# Patient Record
Sex: Male | Born: 1955
Health system: Southern US, Community
[De-identification: ages and names within clinical notes are randomized; demographics above are authoritative.]

## PROBLEM LIST (undated history)

## (undated) DIAGNOSIS — G5600 Carpal tunnel syndrome, unspecified upper limb: Secondary | ICD-10-CM

## (undated) DIAGNOSIS — E785 Hyperlipidemia, unspecified: Secondary | ICD-10-CM

## (undated) DIAGNOSIS — M1990 Unspecified osteoarthritis, unspecified site: Secondary | ICD-10-CM

## (undated) DIAGNOSIS — I10 Essential (primary) hypertension: Secondary | ICD-10-CM

## (undated) DIAGNOSIS — M109 Gout, unspecified: Secondary | ICD-10-CM

## (undated) HISTORY — PX: KNEE ARTHROSCOPY: SUR90

## (undated) HISTORY — PX: BICEPS TENDON REPAIR: SHX566

## (undated) HISTORY — PX: APPENDECTOMY: SHX54

---

## 1998-01-02 ENCOUNTER — Other Ambulatory Visit: Admission: RE | Admit: 1998-01-02 | Discharge: 1998-01-02 | Payer: Self-pay | Admitting: Urology

## 2003-08-30 ENCOUNTER — Ambulatory Visit (HOSPITAL_COMMUNITY): Admission: RE | Admit: 2003-08-30 | Discharge: 2003-08-30 | Payer: Self-pay | Admitting: Orthopedic Surgery

## 2005-03-26 IMAGING — CR DG CHEST 2V
2 series · 2 of 2 positions shown · non-contrast
Comparison: No prior studies for comparison.

CLINICAL DATA: Preop respiratory exam for ruptured biceps tendon.
 CHEST, TWO VIEWS 08/30/03

[view not recorded (1 of 2)]
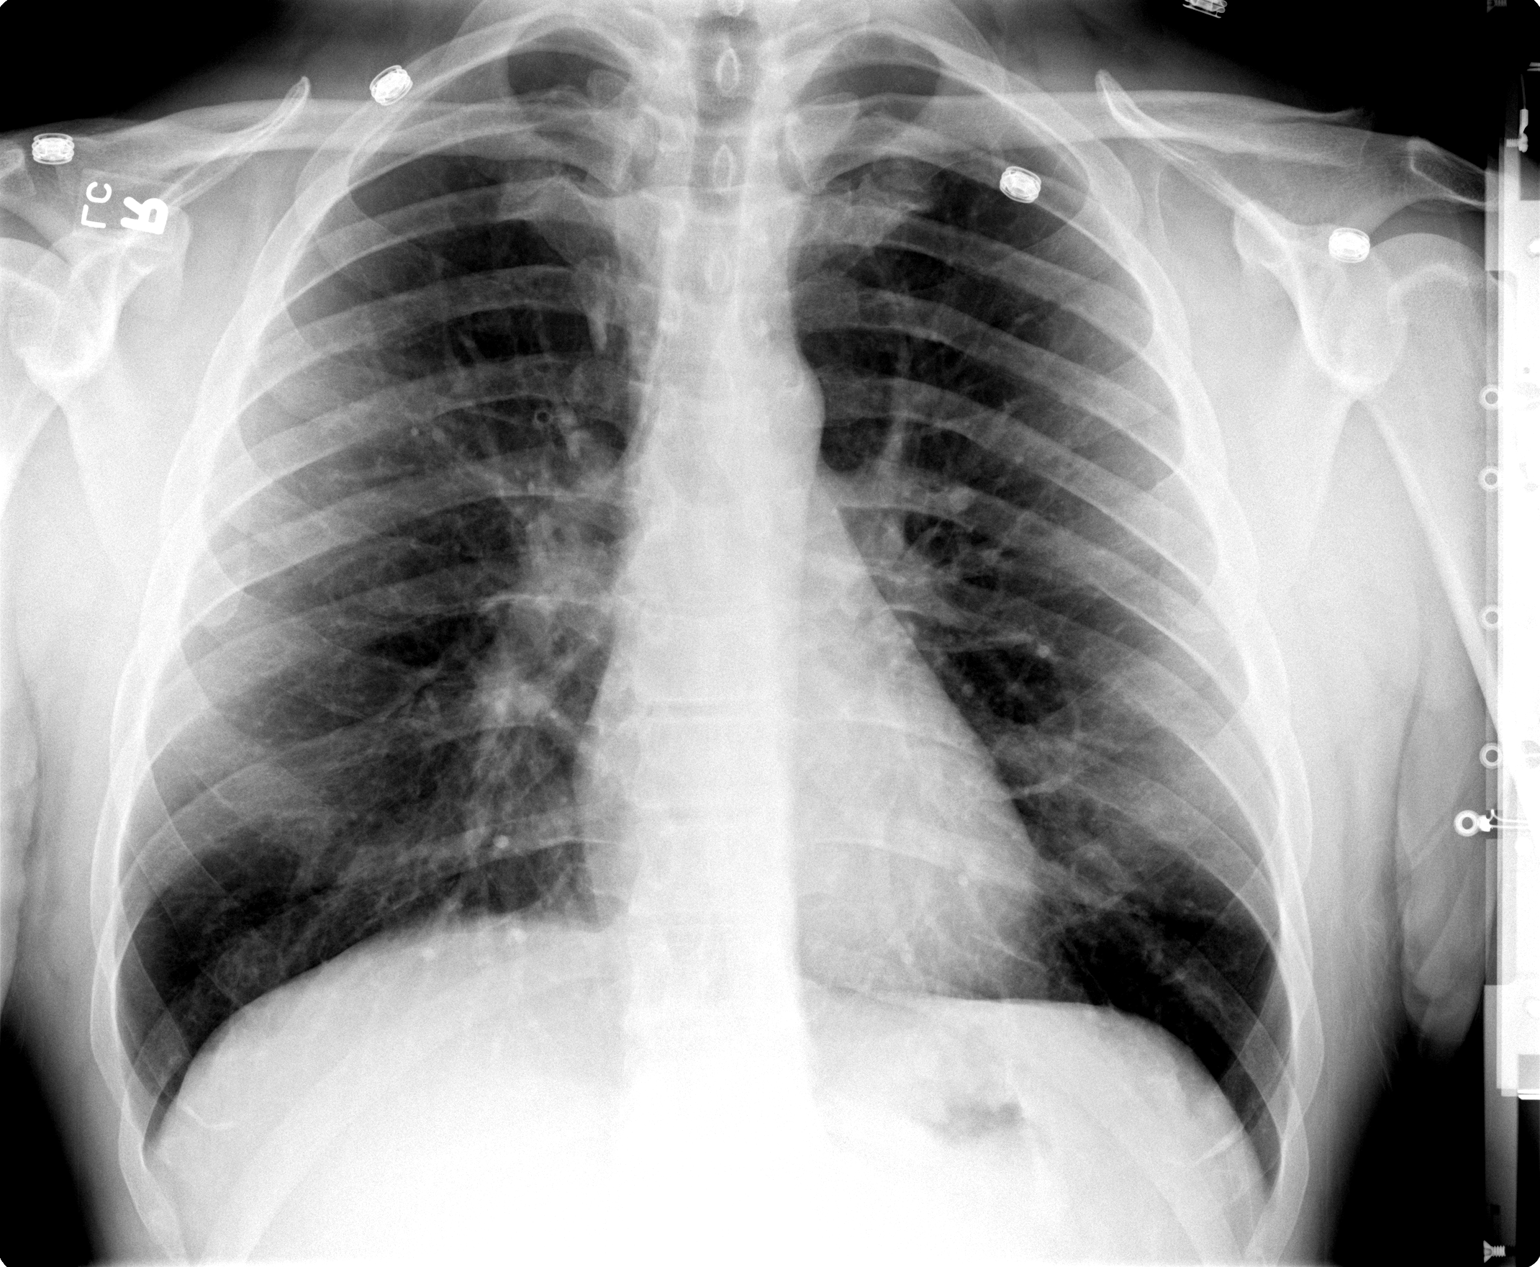

[view not recorded (2 of 2)]
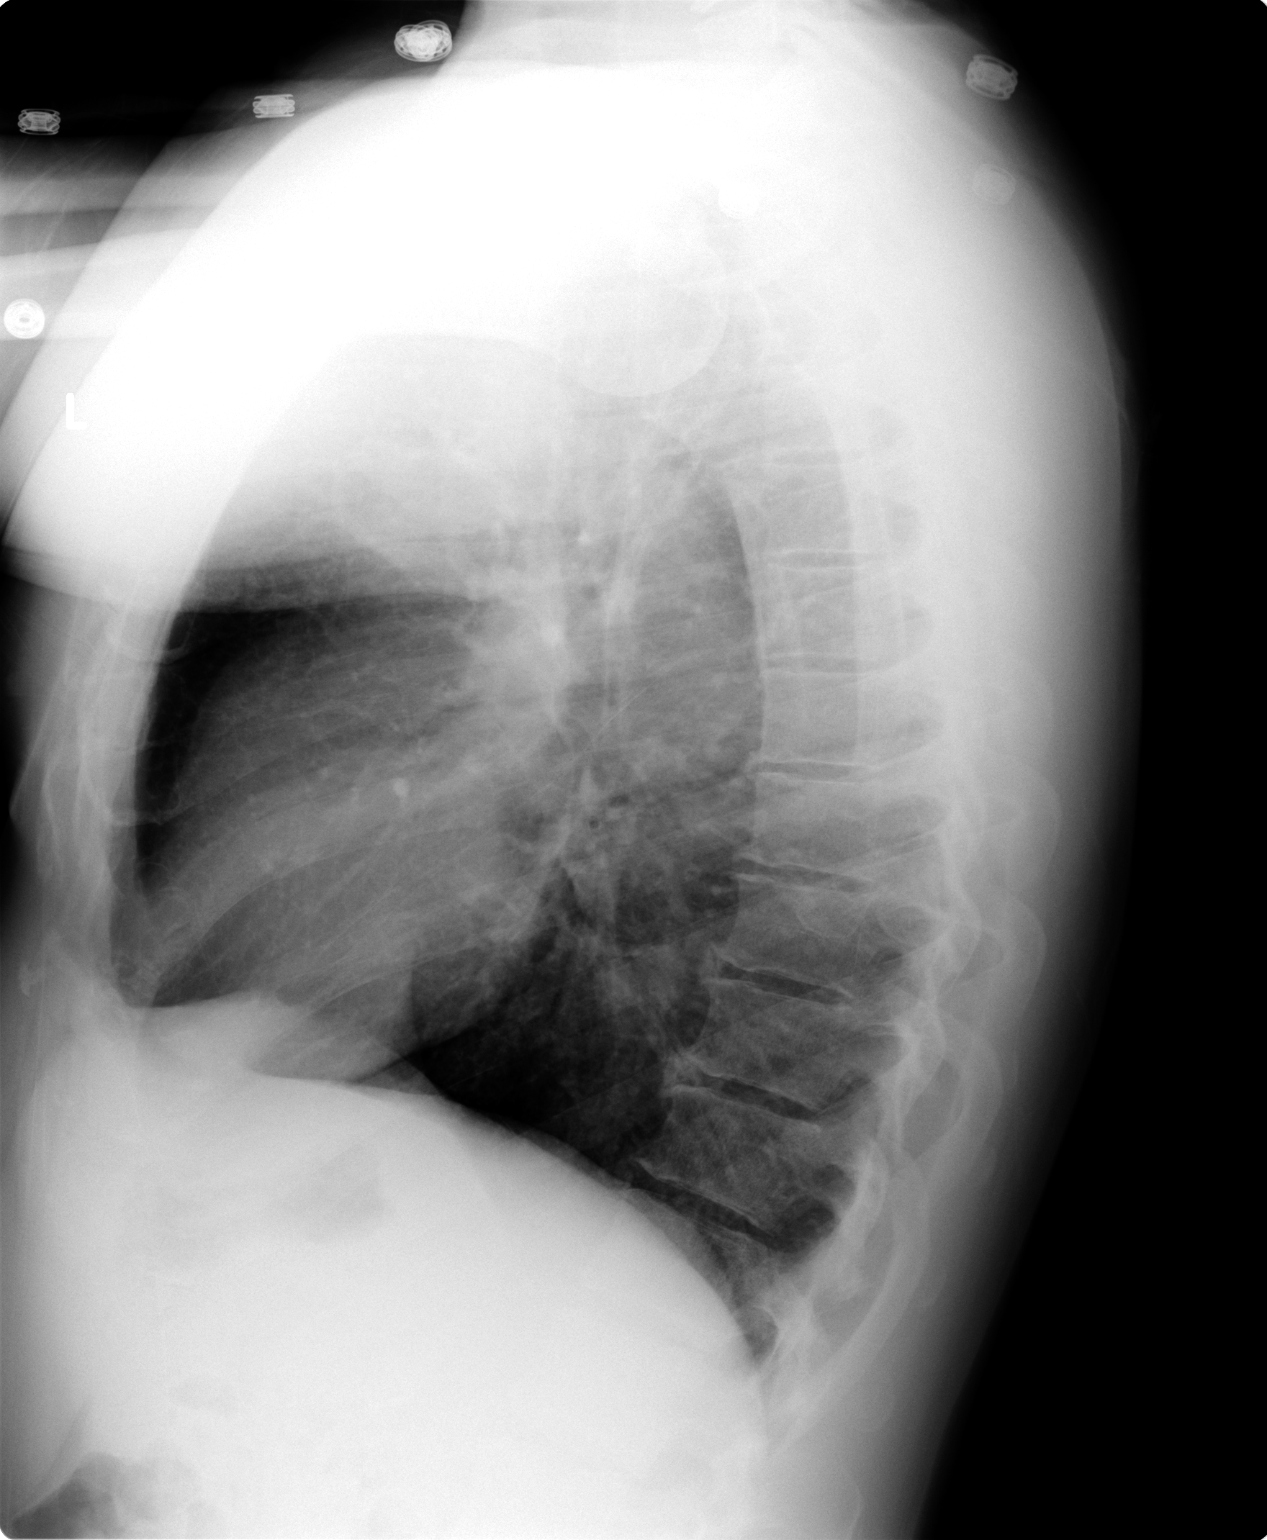

[2 of 2 positions shown; findings below may reference images not displayed]

The lungs show no infiltrates, nodules, or edema.  No pleural effusions.  Heart size is normal.  Bony thorax shows mild spondylosis of mid to lower thoracic spine. 
 IMPRESSION
 No active disease.

## 2012-07-02 ENCOUNTER — Ambulatory Visit
Admission: RE | Admit: 2012-07-02 | Discharge: 2012-07-02 | Disposition: A | Discharge status: Home / Self Care | Payer: 59 | Source: Ambulatory Visit | Attending: Dermatology | Admitting: Dermatology

## 2012-07-02 ENCOUNTER — Other Ambulatory Visit: Payer: Self-pay | Admitting: Dermatology

## 2012-07-02 DIAGNOSIS — R52 Pain, unspecified: Secondary | ICD-10-CM

## 2012-07-02 DIAGNOSIS — IMO0002 Reserved for concepts with insufficient information to code with codable children: Secondary | ICD-10-CM

## 2016-05-02 DIAGNOSIS — M109 Gout, unspecified: Secondary | ICD-10-CM | POA: Diagnosis not present

## 2016-05-02 DIAGNOSIS — Z1211 Encounter for screening for malignant neoplasm of colon: Secondary | ICD-10-CM | POA: Diagnosis not present

## 2016-05-02 DIAGNOSIS — I10 Essential (primary) hypertension: Secondary | ICD-10-CM | POA: Diagnosis not present

## 2016-05-02 DIAGNOSIS — Z13220 Encounter for screening for lipoid disorders: Secondary | ICD-10-CM | POA: Diagnosis not present

## 2016-05-02 DIAGNOSIS — Z0000 Encounter for general adult medical examination without abnormal findings: Secondary | ICD-10-CM | POA: Diagnosis not present

## 2016-05-02 DIAGNOSIS — N5201 Erectile dysfunction due to arterial insufficiency: Secondary | ICD-10-CM | POA: Diagnosis not present

## 2016-05-02 DIAGNOSIS — Z23 Encounter for immunization: Secondary | ICD-10-CM | POA: Diagnosis not present

## 2016-05-02 DIAGNOSIS — Z125 Encounter for screening for malignant neoplasm of prostate: Secondary | ICD-10-CM | POA: Diagnosis not present

## 2016-10-28 DIAGNOSIS — M79642 Pain in left hand: Secondary | ICD-10-CM | POA: Diagnosis not present

## 2016-11-08 DIAGNOSIS — D225 Melanocytic nevi of trunk: Secondary | ICD-10-CM | POA: Diagnosis not present

## 2016-11-08 DIAGNOSIS — D2372 Other benign neoplasm of skin of left lower limb, including hip: Secondary | ICD-10-CM | POA: Diagnosis not present

## 2017-04-09 DIAGNOSIS — M67442 Ganglion, left hand: Secondary | ICD-10-CM | POA: Diagnosis not present

## 2017-05-07 DIAGNOSIS — Z23 Encounter for immunization: Secondary | ICD-10-CM | POA: Diagnosis not present

## 2017-05-07 DIAGNOSIS — M109 Gout, unspecified: Secondary | ICD-10-CM | POA: Diagnosis not present

## 2017-05-07 DIAGNOSIS — Z0000 Encounter for general adult medical examination without abnormal findings: Secondary | ICD-10-CM | POA: Diagnosis not present

## 2017-05-21 DIAGNOSIS — Z0000 Encounter for general adult medical examination without abnormal findings: Secondary | ICD-10-CM | POA: Diagnosis not present

## 2017-05-23 ENCOUNTER — Telehealth: Payer: Self-pay | Admitting: Acute Care

## 2017-05-27 NOTE — Telephone Encounter (Signed)
LMTC x 1 at home number - (Number listed for mobile number was not pt's number)

## 2017-06-09 NOTE — Telephone Encounter (Signed)
Will close this message and refer to referral notes 

## 2017-09-09 DIAGNOSIS — H9312 Tinnitus, left ear: Secondary | ICD-10-CM | POA: Diagnosis not present

## 2017-09-09 DIAGNOSIS — H6123 Impacted cerumen, bilateral: Secondary | ICD-10-CM | POA: Diagnosis not present

## 2018-06-29 DIAGNOSIS — H6123 Impacted cerumen, bilateral: Secondary | ICD-10-CM | POA: Diagnosis not present

## 2018-06-29 DIAGNOSIS — Z13220 Encounter for screening for lipoid disorders: Secondary | ICD-10-CM | POA: Diagnosis not present

## 2018-06-29 DIAGNOSIS — M109 Gout, unspecified: Secondary | ICD-10-CM | POA: Diagnosis not present

## 2018-06-29 DIAGNOSIS — Z1211 Encounter for screening for malignant neoplasm of colon: Secondary | ICD-10-CM | POA: Diagnosis not present

## 2018-06-29 DIAGNOSIS — Z0000 Encounter for general adult medical examination without abnormal findings: Secondary | ICD-10-CM | POA: Diagnosis not present

## 2018-06-29 DIAGNOSIS — Z125 Encounter for screening for malignant neoplasm of prostate: Secondary | ICD-10-CM | POA: Diagnosis not present

## 2018-06-29 DIAGNOSIS — I10 Essential (primary) hypertension: Secondary | ICD-10-CM | POA: Diagnosis not present

## 2018-07-02 DIAGNOSIS — H9312 Tinnitus, left ear: Secondary | ICD-10-CM | POA: Diagnosis not present

## 2018-07-02 DIAGNOSIS — H6121 Impacted cerumen, right ear: Secondary | ICD-10-CM | POA: Diagnosis not present

## 2018-08-31 DIAGNOSIS — E7800 Pure hypercholesterolemia, unspecified: Secondary | ICD-10-CM | POA: Diagnosis not present

## 2019-05-05 DIAGNOSIS — M109 Gout, unspecified: Secondary | ICD-10-CM | POA: Diagnosis not present

## 2019-05-05 DIAGNOSIS — E7800 Pure hypercholesterolemia, unspecified: Secondary | ICD-10-CM | POA: Diagnosis not present

## 2019-05-05 DIAGNOSIS — Z0000 Encounter for general adult medical examination without abnormal findings: Secondary | ICD-10-CM | POA: Diagnosis not present

## 2019-05-05 DIAGNOSIS — Z125 Encounter for screening for malignant neoplasm of prostate: Secondary | ICD-10-CM | POA: Diagnosis not present

## 2019-05-05 DIAGNOSIS — Z23 Encounter for immunization: Secondary | ICD-10-CM | POA: Diagnosis not present

## 2019-05-05 DIAGNOSIS — I10 Essential (primary) hypertension: Secondary | ICD-10-CM | POA: Diagnosis not present

## 2019-05-31 DIAGNOSIS — Z20828 Contact with and (suspected) exposure to other viral communicable diseases: Secondary | ICD-10-CM | POA: Diagnosis not present

## 2019-06-15 DIAGNOSIS — Z23 Encounter for immunization: Secondary | ICD-10-CM | POA: Diagnosis not present

## 2020-06-14 DIAGNOSIS — G5603 Carpal tunnel syndrome, bilateral upper limbs: Secondary | ICD-10-CM | POA: Diagnosis not present

## 2020-06-14 DIAGNOSIS — Z0000 Encounter for general adult medical examination without abnormal findings: Secondary | ICD-10-CM | POA: Diagnosis not present

## 2020-06-14 DIAGNOSIS — M109 Gout, unspecified: Secondary | ICD-10-CM | POA: Diagnosis not present

## 2020-06-14 DIAGNOSIS — I10 Essential (primary) hypertension: Secondary | ICD-10-CM | POA: Diagnosis not present

## 2020-06-14 DIAGNOSIS — E7800 Pure hypercholesterolemia, unspecified: Secondary | ICD-10-CM | POA: Diagnosis not present

## 2020-06-14 DIAGNOSIS — Z125 Encounter for screening for malignant neoplasm of prostate: Secondary | ICD-10-CM | POA: Diagnosis not present

## 2020-06-14 DIAGNOSIS — F5221 Male erectile disorder: Secondary | ICD-10-CM | POA: Diagnosis not present

## 2020-06-14 DIAGNOSIS — Z79899 Other long term (current) drug therapy: Secondary | ICD-10-CM | POA: Diagnosis not present

## 2020-06-14 DIAGNOSIS — Z23 Encounter for immunization: Secondary | ICD-10-CM | POA: Diagnosis not present

## 2020-11-27 ENCOUNTER — Other Ambulatory Visit: Payer: Self-pay

## 2020-11-27 DIAGNOSIS — R202 Paresthesia of skin: Secondary | ICD-10-CM

## 2020-11-28 ENCOUNTER — Ambulatory Visit (INDEPENDENT_AMBULATORY_CARE_PROVIDER_SITE_OTHER): Payer: Medicare HMO | Admitting: Neurology

## 2020-11-28 ENCOUNTER — Other Ambulatory Visit: Payer: Self-pay

## 2020-11-28 DIAGNOSIS — R202 Paresthesia of skin: Secondary | ICD-10-CM | POA: Diagnosis not present

## 2020-11-28 DIAGNOSIS — G5603 Carpal tunnel syndrome, bilateral upper limbs: Secondary | ICD-10-CM

## 2020-11-28 NOTE — Procedures (Signed)
[facility]  [address], [address]  [place], [place] [address] Tel: [phone] Fax:  [phone] Test Date:  11/28/2020  Patient: Frank Moran DOB: 11/05/1955 Physician: Nita Sickle, DO  Sex: Male Height: 5\' 9"  Ref Phys: , M.D.  ID#: [id number]   Technician:    Patient Complaints: This is a 65 year old man referred for evaluation of bilateral hand numbness and pain.  NCV & EMG Findings: Extensive electrodiagnostic testing of the right upper extremity and additional studies of the left shows:  1. Bilateral median sensory responses are absent.  Bilateral ulnar sensory responses are within normal limits. 2. Bilateral median motor responses show severely prolonged latency (L12.2, R10.9 ms) and reduced amplitude (L2.4, R2.6 mV).  Of note, there is evidence of a Martin-Gruber anastomosis bilaterally, a normal anatomic variant.  Bilateral ulnar motor responses are within normal limits.  3. Chronic motor axon loss changes are seen affecting bilateral abductor pollicis brevis muscles, without accompanied active denervation.   Impression: 1. Bilateral median neuropathy at or distal to the wrist, consistent with a clinical diagnosis of carpal tunnel syndrome.  Overall, these findings are very severe in degree electrically. 2. Incidentally, there is evidence of bilateral Martin-Gruber anastomoses, a normal anatomic variant.   ___________________________ 75, DO    Nerve Conduction Studies Anti Sensory Summary Table   Stim Site NR Peak (ms) Norm Peak (ms) P-T Amp (V) Norm P-T Amp  Left Median Anti Sensory (2nd Digit)  32C  Wrist NR  <3.8  >10  Right Median Anti Sensory (2nd Digit)  32C  Wrist NR  <3.8  >10  Left Ulnar Anti Sensory (5th Digit)  32C  Wrist    2.9 <3.2 16.2 >5  Right Ulnar Anti Sensory (5th Digit)  32C  Wrist    3.0 <3.2 16.1 >5   Motor Summary Table   Stim Site NR Onset (ms) Norm Onset (ms) O-P Amp (mV) Norm O-P Amp Site1  Site2 Delta-0 (ms) Dist (cm) Vel (m/s) Norm Vel (m/s)  Left Median Motor (Abd Poll Brev)  32C  Wrist    12.2 <4.0 2.4 >5 Elbow Wrist 5.1 27.0 53 >50  Elbow    17.3  2.0  Ulnar-wrist crossover Elbow 13.1 0.0    Ulnar-wrist crossover    4.2  1.7         Right Median Motor (Abd Poll Brev)  32C  Wrist    10.9 <4.0 2.6 >5 Elbow Wrist 4.5 27.0 60 >50  Elbow    15.4  2.1  Ulnar-wrist crossover Elbow 11.9 0.0    Ulnar-wrist crossover    3.5  1.4         Left Ulnar Motor (Abd Dig Minimi)  32C  Wrist    2.8 <3.1 8.5 >7 B Elbow Wrist 3.7 22.0 59 >50  B Elbow    6.5  8.1  A Elbow B Elbow 2.0 10.0 50 >50  A Elbow    8.5  8.0         Right Ulnar Motor (Abd Dig Minimi)  32C  Wrist    2.7 <3.1 7.3 >7 B Elbow Wrist 3.6 22.0 61 >50  B Elbow    6.3  6.1  A Elbow B Elbow 1.7 10.0 59 >50  A Elbow    8.0  5.9          EMG   Side Muscle Ins Act Fibs Psw Fasc Number Recrt Dur Dur. Amp Amp. Poly Poly. Comment  Right  1stDorInt Nml Nml Nml Nml Nml Nml Nml Nml Nml Nml Nml Nml N/A  Right Abd Poll Brev Nml Nml Nml Nml 3- Rapid Many 1+ Many 1+ Many 1+ N/A  Right Ext Indicis Nml Nml Nml Nml Nml Nml Nml Nml Nml Nml Nml Nml N/A  Right PronatorTeres Nml Nml Nml Nml Nml Nml Nml Nml Nml Nml Nml Nml N/A  Right Biceps Nml Nml Nml Nml Nml Nml Nml Nml Nml Nml Nml Nml N/A  Right Triceps Nml Nml Nml Nml Nml Nml Nml Nml Nml Nml Nml Nml N/A  Right Deltoid Nml Nml Nml Nml Nml Nml Nml Nml Nml Nml Nml Nml N/A  Left 1stDorInt Nml Nml Nml Nml Nml Nml Nml Nml Nml Nml Nml Nml N/A  Left Abd Poll Brev Nml Nml Nml Nml 3- Rapid Most 1+ Most 1+ Most 1+ N/A  Left PronatorTeres Nml Nml Nml Nml Nml Nml Nml Nml Nml Nml Nml Nml N/A  Left Biceps Nml Nml Nml Nml Nml Nml Nml Nml Nml Nml Nml Nml N/A  Left Triceps Nml Nml Nml Nml Nml Nml Nml Nml Nml Nml Nml Nml N/A  Left Deltoid Nml Nml Nml Nml Nml Nml Nml Nml Nml Nml Nml Nml N/A      Waveforms:

## 2020-12-27 DIAGNOSIS — R200 Anesthesia of skin: Secondary | ICD-10-CM | POA: Diagnosis not present

## 2020-12-27 DIAGNOSIS — R202 Paresthesia of skin: Secondary | ICD-10-CM | POA: Diagnosis not present

## 2020-12-27 DIAGNOSIS — M180 Bilateral primary osteoarthritis of first carpometacarpal joints: Secondary | ICD-10-CM | POA: Diagnosis not present

## 2020-12-27 DIAGNOSIS — G5603 Carpal tunnel syndrome, bilateral upper limbs: Secondary | ICD-10-CM | POA: Diagnosis not present

## 2020-12-27 DIAGNOSIS — M19132 Post-traumatic osteoarthritis, left wrist: Secondary | ICD-10-CM | POA: Diagnosis not present

## 2021-01-17 DIAGNOSIS — G5602 Carpal tunnel syndrome, left upper limb: Secondary | ICD-10-CM | POA: Diagnosis not present

## 2021-01-18 ENCOUNTER — Other Ambulatory Visit: Payer: Self-pay | Admitting: Orthopedic Surgery

## 2021-02-20 DIAGNOSIS — H6123 Impacted cerumen, bilateral: Secondary | ICD-10-CM | POA: Diagnosis not present

## 2021-02-21 ENCOUNTER — Other Ambulatory Visit: Payer: Self-pay

## 2021-02-21 ENCOUNTER — Encounter (HOSPITAL_BASED_OUTPATIENT_CLINIC_OR_DEPARTMENT_OTHER): Payer: Self-pay | Admitting: Orthopedic Surgery

## 2021-03-01 ENCOUNTER — Encounter (HOSPITAL_BASED_OUTPATIENT_CLINIC_OR_DEPARTMENT_OTHER): Payer: Self-pay | Admitting: Orthopedic Surgery

## 2021-03-01 ENCOUNTER — Ambulatory Visit (HOSPITAL_BASED_OUTPATIENT_CLINIC_OR_DEPARTMENT_OTHER)
Admission: RE | Admit: 2021-03-01 | Discharge: 2021-03-01 | Disposition: A | Discharge status: Home / Self Care | Payer: Medicare HMO | Attending: Orthopedic Surgery | Admitting: Orthopedic Surgery

## 2021-03-01 ENCOUNTER — Ambulatory Visit (HOSPITAL_BASED_OUTPATIENT_CLINIC_OR_DEPARTMENT_OTHER): Payer: Medicare HMO | Admitting: Anesthesiology

## 2021-03-01 ENCOUNTER — Ambulatory Visit (HOSPITAL_BASED_OUTPATIENT_CLINIC_OR_DEPARTMENT_OTHER): Payer: Medicare HMO

## 2021-03-01 ENCOUNTER — Encounter (HOSPITAL_BASED_OUTPATIENT_CLINIC_OR_DEPARTMENT_OTHER)
Admission: RE | Disposition: A | Discharge status: Home / Self Care | Payer: Self-pay | Source: Home / Self Care | Attending: Orthopedic Surgery

## 2021-03-01 ENCOUNTER — Other Ambulatory Visit: Payer: Self-pay

## 2021-03-01 DIAGNOSIS — G5603 Carpal tunnel syndrome, bilateral upper limbs: Secondary | ICD-10-CM | POA: Diagnosis not present

## 2021-03-01 DIAGNOSIS — I10 Essential (primary) hypertension: Secondary | ICD-10-CM | POA: Diagnosis not present

## 2021-03-01 DIAGNOSIS — Z87891 Personal history of nicotine dependence: Secondary | ICD-10-CM | POA: Diagnosis not present

## 2021-03-01 DIAGNOSIS — E785 Hyperlipidemia, unspecified: Secondary | ICD-10-CM | POA: Diagnosis not present

## 2021-03-01 DIAGNOSIS — G5602 Carpal tunnel syndrome, left upper limb: Secondary | ICD-10-CM | POA: Diagnosis not present

## 2021-03-01 HISTORY — DX: Essential (primary) hypertension: I10

## 2021-03-01 HISTORY — DX: Carpal tunnel syndrome, unspecified upper limb: G56.00

## 2021-03-01 HISTORY — DX: Hyperlipidemia, unspecified: E78.5

## 2021-03-01 HISTORY — PX: CARPAL TUNNEL RELEASE: SHX101

## 2021-03-01 HISTORY — DX: Unspecified osteoarthritis, unspecified site: M19.90

## 2021-03-01 HISTORY — DX: Gout, unspecified: M10.9

## 2021-03-01 SURGERY — CARPAL TUNNEL RELEASE
Anesthesia: Monitor Anesthesia Care | Site: Wrist | Laterality: Left

## 2021-03-01 MED ORDER — CEFAZOLIN SODIUM-DEXTROSE 2-4 GM/100ML-% IV SOLN
INTRAVENOUS | Status: AC
  Filled 2021-03-01: qty 100

## 2021-03-01 MED ORDER — MIDAZOLAM HCL 2 MG/2ML IJ SOLN
INTRAMUSCULAR | Status: AC
  Filled 2021-03-01: qty 2

## 2021-03-01 MED ORDER — FENTANYL CITRATE (PF) 100 MCG/2ML IJ SOLN: 25.0000 ug | INTRAMUSCULAR | Status: DC | PRN

## 2021-03-01 MED ORDER — LACTATED RINGERS IV SOLN: INTRAVENOUS | Status: DC

## 2021-03-01 MED ORDER — ACETAMINOPHEN 10 MG/ML IV SOLN: 1000.0000 mg | Freq: Once | INTRAVENOUS | Status: DC | PRN

## 2021-03-01 MED ORDER — BUPIVACAINE HCL (PF) 0.25 % IJ SOLN
INTRAMUSCULAR | Status: DC | PRN
  Administered 2021-03-01: 8 mL

## 2021-03-01 MED ORDER — ACETAMINOPHEN 160 MG/5ML PO SOLN: 1000.0000 mg | Freq: Once | ORAL | Status: DC | PRN

## 2021-03-01 MED ORDER — ONDANSETRON HCL 4 MG/2ML IJ SOLN
INTRAMUSCULAR | Status: DC | PRN
  Administered 2021-03-01: 4 mg via INTRAVENOUS

## 2021-03-01 MED ORDER — MIDAZOLAM HCL 5 MG/5ML IJ SOLN
INTRAMUSCULAR | Status: DC | PRN
Start: 2021-03-01 — End: 2021-03-01
  Administered 2021-03-01: 2 mg via INTRAVENOUS

## 2021-03-01 MED ORDER — FENTANYL CITRATE (PF) 100 MCG/2ML IJ SOLN
INTRAMUSCULAR | Status: DC | PRN
  Administered 2021-03-01: 100 ug via INTRAVENOUS

## 2021-03-01 MED ORDER — LIDOCAINE HCL (PF) 0.5 % IJ SOLN
INTRAMUSCULAR | Status: DC | PRN
  Administered 2021-03-01: 30 mL via INTRAVENOUS

## 2021-03-01 MED ORDER — PROPOFOL 10 MG/ML IV BOLUS
INTRAVENOUS | Status: AC
  Filled 2021-03-01: qty 20

## 2021-03-01 MED ORDER — PROPOFOL 10 MG/ML IV BOLUS
INTRAVENOUS | Status: DC | PRN
  Administered 2021-03-01 (×2): 10 mg via INTRAVENOUS
  Administered 2021-03-01: 20 mg via INTRAVENOUS
  Administered 2021-03-01 (×3): 10 mg via INTRAVENOUS

## 2021-03-01 MED ORDER — CEFAZOLIN SODIUM-DEXTROSE 2-4 GM/100ML-% IV SOLN
2.0000 g | INTRAVENOUS | Status: AC
  Administered 2021-03-01: 2 g via INTRAVENOUS

## 2021-03-01 MED ORDER — TRAMADOL HCL 50 MG PO TABS: 50.0000 mg | ORAL_TABLET | Freq: Four times a day (QID) | ORAL | 0 refills | Status: AC | PRN

## 2021-03-01 MED ORDER — OXYCODONE HCL 5 MG/5ML PO SOLN
5.0000 mg | Freq: Once | ORAL | Status: DC | PRN
Start: 2021-03-01 — End: 2021-03-01

## 2021-03-01 MED ORDER — 0.9 % SODIUM CHLORIDE (POUR BTL) OPTIME
TOPICAL | Status: DC | PRN
  Administered 2021-03-01: 50 mL

## 2021-03-01 MED ORDER — FENTANYL CITRATE (PF) 100 MCG/2ML IJ SOLN
INTRAMUSCULAR | Status: AC
  Filled 2021-03-01: qty 2

## 2021-03-01 MED ORDER — ONDANSETRON HCL 4 MG/2ML IJ SOLN
INTRAMUSCULAR | Status: AC
  Filled 2021-03-01: qty 2

## 2021-03-01 MED ORDER — OXYCODONE HCL 5 MG PO TABS: 5.0000 mg | ORAL_TABLET | Freq: Once | ORAL | Status: DC | PRN

## 2021-03-01 MED ORDER — ACETAMINOPHEN 500 MG PO TABS: 1000.0000 mg | ORAL_TABLET | Freq: Once | ORAL | Status: DC | PRN

## 2021-03-01 SURGICAL SUPPLY — 33 items
APL PRP STRL LF DISP 70% ISPRP (MISCELLANEOUS) ×1
BLADE SURG 15 STRL LF DISP TIS (BLADE) ×1 IMPLANT
BLADE SURG 15 STRL SS (BLADE) ×2
BNDG CMPR 9X4 STRL LF SNTH (GAUZE/BANDAGES/DRESSINGS)
BNDG COHESIVE 3X5 TAN STRL LF (GAUZE/BANDAGES/DRESSINGS) ×2 IMPLANT
BNDG ESMARK 4X9 LF (GAUZE/BANDAGES/DRESSINGS) IMPLANT
BNDG GAUZE ELAST 4 BULKY (GAUZE/BANDAGES/DRESSINGS) ×2 IMPLANT
CHLORAPREP W/TINT 26 (MISCELLANEOUS) ×2 IMPLANT
CORD BIPOLAR FORCEPS 12FT (ELECTRODE) ×2 IMPLANT
COVER BACK TABLE 60X90IN (DRAPES) ×2 IMPLANT
COVER MAYO STAND STRL (DRAPES) ×2 IMPLANT
CUFF TOURN SGL QUICK 18X4 (TOURNIQUET CUFF) ×2 IMPLANT
DRAPE EXTREMITY T 121X128X90 (DISPOSABLE) ×2 IMPLANT
DRAPE SURG 17X23 STRL (DRAPES) ×2 IMPLANT
DRSG PAD ABDOMINAL 8X10 ST (GAUZE/BANDAGES/DRESSINGS) ×2 IMPLANT
GAUZE SPONGE 4X4 12PLY STRL (GAUZE/BANDAGES/DRESSINGS) ×2 IMPLANT
GAUZE XEROFORM 1X8 LF (GAUZE/BANDAGES/DRESSINGS) ×2 IMPLANT
GLOVE SURG ORTHO LTX SZ8 (GLOVE) ×2 IMPLANT
GLOVE SURG UNDER POLY LF SZ8.5 (GLOVE) ×2 IMPLANT
GOWN STRL REUS W/ TWL LRG LVL3 (GOWN DISPOSABLE) IMPLANT
GOWN STRL REUS W/ TWL XL LVL3 (GOWN DISPOSABLE) ×1 IMPLANT
GOWN STRL REUS W/TWL LRG LVL3 (GOWN DISPOSABLE)
GOWN STRL REUS W/TWL XL LVL3 (GOWN DISPOSABLE) ×4 IMPLANT
NEEDLE PRECISIONGLIDE 27X1.5 (NEEDLE) ×2 IMPLANT
NS IRRIG 1000ML POUR BTL (IV SOLUTION) ×2 IMPLANT
PACK BASIN DAY SURGERY FS (CUSTOM PROCEDURE TRAY) ×2 IMPLANT
STOCKINETTE 4X48 STRL (DRAPES) ×2 IMPLANT
SUT ETHILON 4 0 PS 2 18 (SUTURE) ×2 IMPLANT
SUT VICRYL 4-0 PS2 18IN ABS (SUTURE) IMPLANT
SYR BULB EAR ULCER 3OZ GRN STR (SYRINGE) ×2 IMPLANT
SYR CONTROL 10ML LL (SYRINGE) ×2 IMPLANT
TOWEL GREEN STERILE FF (TOWEL DISPOSABLE) ×2 IMPLANT
UNDERPAD 30X36 HEAVY ABSORB (UNDERPADS AND DIAPERS) ×2 IMPLANT

## 2021-03-01 NOTE — Discharge Instructions (Addendum)

## 2021-03-01 NOTE — Anesthesia Postprocedure Evaluation (Signed)
Anesthesia Post Note  Patient: Frank Moran  Procedure(s) Performed: LEFT CARPAL TUNNEL RELEASE (Left: Wrist)     Patient location during evaluation: PACU Anesthesia Type: MAC Level of consciousness: awake and alert Pain management: pain level controlled Vital Signs Assessment: post-procedure vital signs reviewed and stable Respiratory status: spontaneous breathing, nonlabored ventilation, respiratory function stable and patient connected to nasal cannula oxygen Cardiovascular status: stable and blood pressure returned to baseline Postop Assessment: no apparent nausea or vomiting Anesthetic complications: no   No notable events documented.  Last Vitals:  Vitals:   03/01/21 0919 03/01/21 0930  BP: 104/68 118/81  Pulse: 69 66  Resp: 13 12  Temp: 36.6 C   SpO2: 96% 96%    Last Pain:  Vitals:   03/01/21 0919  TempSrc:   PainSc: 0-No pain                 Yannis Broce

## 2021-03-01 NOTE — H&P (Signed)
  Frank Moran is an 65 y.o. male.   Chief Complaint: numbness left hand HPI: Frank Moran is a 65 year old left hand dominant male referred by Dr. Docia Chuck for consultation regarding numbness and tingling with positive nerve conductions revealing severe carpal tunnel syndrome on the right with a motor delay of 12 2 on the left and 9 on the right and no response to the sensory component bilaterally. These were done in May 2022. These were done by Dr. Allena Moran. He is complaining of numbness tingling pain both hands left greater than right this been going on for years increasing over the past 6 months. He has a sharp pain with a VAS score 8/10. He has no history of injury to the hand or to the neck. He states driving and using pinch motion frequently increases his discomfort for him. He has not had any treatment for this. He has a history of arthritis no history of diabetes thyroid problems he does have a history of gout. Family history is positive arthritis negative for the remainder.  Past Medical History:  Diagnosis Date   Arthritis    Carpal tunnel syndrome    Gout    Hyperlipidemia    Hypertension     Past Surgical History:  Procedure Laterality Date   APPENDECTOMY     BICEPS TENDON REPAIR Left    KNEE ARTHROSCOPY Bilateral    right x5 left x1    History reviewed. No pertinent family history. Social History:  reports that he has quit smoking. His smoking use included cigarettes. He has never used smokeless tobacco. He reports current alcohol use. No history on file for drug use.  Allergies: No Known Allergies  No medications prior to admission.    No results found for this or any previous visit (from the past 48 hour(s)).  No results found.   Pertinent items are noted in HPI.  Height 5\' 9"  (1.753 m), weight 77.1 kg.  General appearance: alert, cooperative, and appears stated age Head: Normocephalic, without obvious abnormality Neck: no JVD Resp: clear to auscultation  bilaterally Cardio: regular rate and rhythm, S1, S2 normal, no murmur, click, rub or gallop GI: soft, non-tender; bowel sounds normal; no masses,  no organomegaly Extremities: numbness left hand Pulses: 2+ and symmetric Skin: Skin color, texture, turgor normal. No rashes or lesions Neurologic: Grossly normal Incision/Wound: na  Assessment/Plan Diagnosed bilateral carpal tunnel syndrome Plan: We have discussed of the nature of the problem with him his notes from his PCP are personally viewed along with his nerve current conductions from Dr. . We have discussed possibility of surgical decompression versus injection versus anti-inflammatory. He is advised that it is unlikely that conservative treatment is going to afford him any relief of symptoms. We have discussed with him however if he would desire to do that we could proceed with that. We have discussed surgical intervention along with risk and complications including infection recurrence injury to arteries nerves tendons complete relief symptoms dystrophy. He prefers to have the left done first. He anticipates having the right at a later time.  Frank Moran 03/01/2021, 5:56 AM

## 2021-03-01 NOTE — Anesthesia Preprocedure Evaluation (Addendum)
Anesthesia Evaluation  Patient identified by MRN, date of birth, ID band Patient awake    Reviewed: Allergy & Precautions, NPO status , Patient's Chart, lab work & pertinent test results  History of Anesthesia Complications Negative for: history of anesthetic complications  Airway Mallampati: II  TM Distance: >3 FB Neck ROM: Full    Dental  (+) Upper Dentures, Dental Advisory Given   Pulmonary neg shortness of breath, neg sleep apnea, neg COPD, neg recent URI, former smoker,    breath sounds clear to auscultation       Cardiovascular hypertension, Pt. on medications (-) angina(-) Past MI and (-) CHF  Rhythm:Regular     Neuro/Psych negative neurological ROS  negative psych ROS   GI/Hepatic negative GI ROS, Neg liver ROS,   Endo/Other  negative endocrine ROS  Renal/GU negative Renal ROS     Musculoskeletal  (+) Arthritis ,   Abdominal   Peds  Hematology negative hematology ROS (+)   Anesthesia Other Findings   Reproductive/Obstetrics                            Anesthesia Physical Anesthesia Plan  ASA: 2  Anesthesia Plan: MAC and Bier Block and Bier Block-LIDOCAINE ONLY   Post-op Pain Management:    Induction:   PONV Risk Score and Plan: 1 and Propofol infusion and Treatment may vary due to age or medical condition  Airway Management Planned: Nasal Cannula  Additional Equipment: None  Intra-op Plan:   Post-operative Plan:   Informed Consent: I have reviewed the patients History and Physical, chart, labs and discussed the procedure including the risks, benefits and alternatives for the proposed anesthesia with the patient or authorized representative who has indicated his/her understanding and acceptance.     Dental advisory given  Plan Discussed with: CRNA and Anesthesiologist  Anesthesia Plan Comments:         Anesthesia Quick Evaluation

## 2021-03-01 NOTE — Anesthesia Procedure Notes (Signed)
Anesthesia Regional Block: Bier block (IV Regional)   Pre-Anesthetic Checklist: , timeout performed,  Correct Patient, Correct Site, Correct Laterality,  Correct Procedure,, site marked,  Surgical consent,  At surgeon's request  Laterality: Right         Needles:  Injection technique: Single-shot  Needle Type: Other      Needle Gauge: 22     Additional Needles:   Procedures:,,,,, intact distal pulses, Esmarch exsanguination,  Single tourniquet utilized    Narrative:  Start time: 03/01/2021 8:48 AM End time: 03/01/2021 8:48 AM Injection made incrementally with aspirations every 30 mL.  Performed by: Personally

## 2021-03-01 NOTE — Transfer of Care (Signed)
Immediate Anesthesia Transfer of Care Note  Patient: Frank Moran  Procedure(s) Performed: LEFT CARPAL TUNNEL RELEASE (Left: Wrist)  Patient Location: PACU  Anesthesia Type:MAC and Bier block  Level of Consciousness: sedated  Airway & Oxygen Therapy: Patient Spontanous Breathing and Patient connected to face mask oxygen  Post-op Assessment: Report given to RN and Post -op Vital signs reviewed and stable  Post vital signs: Reviewed and stable  Last Vitals:  Vitals Value Taken Time  BP 104/68 03/01/21 0919  Temp 36.6 C 03/01/21 0919  Pulse 67 03/01/21 0923  Resp 12 03/01/21 0923  SpO2 95 % 03/01/21 0923  Vitals shown include unvalidated device data.  Last Pain:  Vitals:   03/01/21 0919  TempSrc:   PainSc: 0-No pain         Complications: No notable events documented.

## 2021-03-01 NOTE — Op Note (Signed)
NAME: Frank Moran MEDICAL RECORD NO: [id number] DATE OF BIRTH: 05-21-1956 FACILITY: [facility] LOCATION: [facility] PHYSICIAN: Nicki Reaper, MD   OPERATIVE REPORT   DATE OF PROCEDURE: 03/01/21    PREOPERATIVE DIAGNOSIS: Carpal tunnel syndrome left   POSTOPERATIVE DIAGNOSIS: Same   PROCEDURE: Decompression median nerve left hand   SURGEON: Cindee Salt, M.D.   ASSISTANT: none   ANESTHESIA:  Bier block with sedation and Local   INTRAVENOUS FLUIDS:  Per anesthesia flow sheet.   ESTIMATED BLOOD LOSS:  Minimal.   COMPLICATIONS:  None.   SPECIMENS:  none   TOURNIQUET TIME:    Total Tourniquet Time Documented: Forearm (Left) - 23 minutes Total: Forearm (Left) - 23 minutes    DISPOSITION:  Stable to PACU.   INDICATIONS: Patient is a 65 year old male with a history of carpal tunnel syndrome nerve conductions positive which is not responded to conservative treatment he is elected undergo surgical decompression median nerve left hand.  Pre-.  Postoperative course been discussed along with risks and complications.  He is aware there is no guarantee to the surgery the possibility of infection recurrence injury to arteries nerves tendons incomplete relief symptoms dystrophy.  Preoperative.  Patient seen extremity marked by both patient and surgeon antibiotic given  OPERATIVE COURSE: Patient is brought to the operating room placed in supine position with left arm free a forearm IV regional anesthetic was carried out without difficulty under the direction anesthesia department.  He was prepped with ChloraPrep.  3-minute drying time was allowed timeout taken to confirm patient procedure.  A longitudinal incision was made left palm carried down through subcutaneous tissue.  Bleeders were electrocauterized with bipolar.  Palmar fascia was split.  Superficial palmar arch was identified along the flexor tendon the ring little finger.  Retractors were placed retracting median  nerve radially ulnar nerve ulnarly and the flexor retinaculum was then released on its ulnar border.  A sewell and sect C retractor were then placed between skin and forearm fascia.  The proximal aspect of the flexor retinaculum distal forearm fascia was then released approximately 2 cm proximal to the wrist crease under direct vision with blunt scissors after dissecting deep tissues free.  The nerve was explored motor branch entered into muscle distally.  No area compression of the nerve is apparent.  No further lesions were identified.  The wound was copiously irrigated with saline.  Skin was closed interrupted 4 nylon sutures.  Local infiltration quarter percent bupivacaine without epinephrine was given 9 cc was used.  Sterile compressive dressing with the fingers free was applied.  Deflation of the tourniquet all fingers immediately pink.  He was taken to the recovery room for observation in satisfactory condition.  He will be discharged home to return to the hand center [facility] in 1 week and Tylenol ibuprofen for pain with Ultram for breakthrough.   Cindee Salt, MD Electronically signed, 03/01/21

## 2021-03-01 NOTE — Brief Op Note (Signed)
03/01/2021  9:21 AM  PATIENT:  Frank Moran  65 y.o. male  PRE-OPERATIVE DIAGNOSIS:  LEFT CARPAL TUNNEL SYNDROME  POST-OPERATIVE DIAGNOSIS:  LEFT CARPAL TUNNEL SYNDROME  PROCEDURE:  Procedure(s) with comments: LEFT CARPAL TUNNEL RELEASE (Left) - 45 MIN  SURGEON:  Surgeon(s) and Role:    * Cindee Salt, MD - Primary  PHYSICIAN ASSISTANT:   ASSISTANTS: none   ANESTHESIA:   local, regional, and IV sedation  EBL:  41ml  BLOOD ADMINISTERED:none  DRAINS: none   LOCAL MEDICATIONS USED:  BUPIVICAINE   SPECIMEN:  No Specimen  DISPOSITION OF SPECIMEN:  N/A  COUNTS:  YES  TOURNIQUET:   Total Tourniquet Time Documented: Forearm (Left) - 23 minutes Total: Forearm (Left) - 23 minutes   DICTATION: .Reubin Milan Dictation  PLAN OF CARE: Discharge to home after PACU  PATIENT DISPOSITION:  PACU - hemodynamically stable.

## 2021-03-02 ENCOUNTER — Encounter (HOSPITAL_BASED_OUTPATIENT_CLINIC_OR_DEPARTMENT_OTHER): Payer: Self-pay | Admitting: Orthopedic Surgery

## 2021-04-16 ENCOUNTER — Other Ambulatory Visit: Payer: Self-pay | Admitting: Orthopedic Surgery

## 2021-05-03 ENCOUNTER — Other Ambulatory Visit: Payer: Self-pay

## 2021-05-03 ENCOUNTER — Encounter (HOSPITAL_BASED_OUTPATIENT_CLINIC_OR_DEPARTMENT_OTHER): Payer: Self-pay | Admitting: Orthopedic Surgery

## 2021-05-11 ENCOUNTER — Encounter (HOSPITAL_BASED_OUTPATIENT_CLINIC_OR_DEPARTMENT_OTHER)
Admission: RE | Disposition: A | Discharge status: Home / Self Care | Payer: Self-pay | Source: Home / Self Care | Attending: Orthopedic Surgery

## 2021-05-11 ENCOUNTER — Encounter (HOSPITAL_BASED_OUTPATIENT_CLINIC_OR_DEPARTMENT_OTHER): Payer: Self-pay | Admitting: Orthopedic Surgery

## 2021-05-11 ENCOUNTER — Ambulatory Visit (HOSPITAL_BASED_OUTPATIENT_CLINIC_OR_DEPARTMENT_OTHER)
Admission: RE | Admit: 2021-05-11 | Discharge: 2021-05-11 | Disposition: A | Discharge status: Home / Self Care | Payer: Medicare HMO | Attending: Orthopedic Surgery | Admitting: Orthopedic Surgery

## 2021-05-11 ENCOUNTER — Other Ambulatory Visit: Payer: Self-pay

## 2021-05-11 ENCOUNTER — Ambulatory Visit (HOSPITAL_BASED_OUTPATIENT_CLINIC_OR_DEPARTMENT_OTHER): Payer: Medicare HMO | Admitting: Anesthesiology

## 2021-05-11 DIAGNOSIS — M109 Gout, unspecified: Secondary | ICD-10-CM | POA: Insufficient documentation

## 2021-05-11 DIAGNOSIS — M1990 Unspecified osteoarthritis, unspecified site: Secondary | ICD-10-CM | POA: Insufficient documentation

## 2021-05-11 DIAGNOSIS — G5603 Carpal tunnel syndrome, bilateral upper limbs: Secondary | ICD-10-CM | POA: Diagnosis not present

## 2021-05-11 DIAGNOSIS — I10 Essential (primary) hypertension: Secondary | ICD-10-CM | POA: Diagnosis not present

## 2021-05-11 DIAGNOSIS — G5601 Carpal tunnel syndrome, right upper limb: Secondary | ICD-10-CM | POA: Diagnosis not present

## 2021-05-11 DIAGNOSIS — Z87891 Personal history of nicotine dependence: Secondary | ICD-10-CM | POA: Diagnosis not present

## 2021-05-11 DIAGNOSIS — E785 Hyperlipidemia, unspecified: Secondary | ICD-10-CM | POA: Diagnosis not present

## 2021-05-11 HISTORY — PX: CARPAL TUNNEL RELEASE: SHX101

## 2021-05-11 SURGERY — CARPAL TUNNEL RELEASE
Anesthesia: Monitor Anesthesia Care | Site: Wrist | Laterality: Right

## 2021-05-11 MED ORDER — OXYCODONE HCL 5 MG/5ML PO SOLN: 5.0000 mg | Freq: Once | ORAL | Status: DC | PRN

## 2021-05-11 MED ORDER — FENTANYL CITRATE (PF) 100 MCG/2ML IJ SOLN
INTRAMUSCULAR | Status: AC
  Filled 2021-05-11: qty 2

## 2021-05-11 MED ORDER — CEFAZOLIN SODIUM-DEXTROSE 2-4 GM/100ML-% IV SOLN
2.0000 g | INTRAVENOUS | Status: AC
  Administered 2021-05-11: 2 g via INTRAVENOUS

## 2021-05-11 MED ORDER — 0.9 % SODIUM CHLORIDE (POUR BTL) OPTIME
TOPICAL | Status: DC | PRN
  Administered 2021-05-11: 100 mL

## 2021-05-11 MED ORDER — LACTATED RINGERS IV SOLN: INTRAVENOUS | Status: DC

## 2021-05-11 MED ORDER — PROMETHAZINE HCL 25 MG/ML IJ SOLN: 6.2500 mg | INTRAMUSCULAR | Status: DC | PRN

## 2021-05-11 MED ORDER — MIDAZOLAM HCL 5 MG/5ML IJ SOLN
INTRAMUSCULAR | Status: DC | PRN
  Administered 2021-05-11: 2 mg via INTRAVENOUS

## 2021-05-11 MED ORDER — PROPOFOL 500 MG/50ML IV EMUL
INTRAVENOUS | Status: DC | PRN
  Administered 2021-05-11: 30 mg via INTRAVENOUS
  Administered 2021-05-11: 200 ug/kg/min via INTRAVENOUS
  Administered 2021-05-11: 20 mg via INTRAVENOUS

## 2021-05-11 MED ORDER — ONDANSETRON HCL 4 MG/2ML IJ SOLN
INTRAMUSCULAR | Status: DC | PRN
  Administered 2021-05-11: 4 mg via INTRAVENOUS

## 2021-05-11 MED ORDER — CEFAZOLIN SODIUM-DEXTROSE 2-4 GM/100ML-% IV SOLN
INTRAVENOUS | Status: AC
  Filled 2021-05-11: qty 100

## 2021-05-11 MED ORDER — MIDAZOLAM HCL 2 MG/2ML IJ SOLN
INTRAMUSCULAR | Status: AC
  Filled 2021-05-11: qty 2

## 2021-05-11 MED ORDER — BUPIVACAINE HCL (PF) 0.25 % IJ SOLN
INTRAMUSCULAR | Status: AC
  Filled 2021-05-11: qty 90

## 2021-05-11 MED ORDER — HYDROMORPHONE HCL 1 MG/ML IJ SOLN: 0.2500 mg | INTRAMUSCULAR | Status: DC | PRN

## 2021-05-11 MED ORDER — BUPIVACAINE HCL (PF) 0.25 % IJ SOLN
INTRAMUSCULAR | Status: DC | PRN
  Administered 2021-05-11: 10 mL

## 2021-05-11 MED ORDER — FENTANYL CITRATE (PF) 100 MCG/2ML IJ SOLN
INTRAMUSCULAR | Status: DC | PRN
  Administered 2021-05-11 (×4): 25 ug via INTRAVENOUS

## 2021-05-11 MED ORDER — BUPIVACAINE HCL (PF) 0.25 % IJ SOLN
INTRAMUSCULAR | Status: AC
  Filled 2021-05-11: qty 30

## 2021-05-11 MED ORDER — PHENYLEPHRINE HCL (PRESSORS) 10 MG/ML IV SOLN
INTRAVENOUS | Status: DC | PRN
  Administered 2021-05-11: 80 ug via INTRAVENOUS

## 2021-05-11 MED ORDER — OXYCODONE HCL 5 MG PO TABS: 5.0000 mg | ORAL_TABLET | Freq: Once | ORAL | Status: DC | PRN

## 2021-05-11 MED ORDER — ONDANSETRON HCL 4 MG/2ML IJ SOLN
INTRAMUSCULAR | Status: AC
  Filled 2021-05-11: qty 2

## 2021-05-11 MED ORDER — LIDOCAINE HCL (PF) 0.5 % IJ SOLN
INTRAMUSCULAR | Status: DC | PRN
  Administered 2021-05-11: 30 mL via INTRAVENOUS

## 2021-05-11 SURGICAL SUPPLY — 32 items
APL PRP STRL LF DISP 70% ISPRP (MISCELLANEOUS) ×1
BLADE SURG 15 STRL LF DISP TIS (BLADE) ×1 IMPLANT
BLADE SURG 15 STRL SS (BLADE) ×2
BNDG CMPR 9X4 STRL LF SNTH (GAUZE/BANDAGES/DRESSINGS)
BNDG COHESIVE 3X5 TAN ST LF (GAUZE/BANDAGES/DRESSINGS) ×2 IMPLANT
BNDG ESMARK 4X9 LF (GAUZE/BANDAGES/DRESSINGS) IMPLANT
BNDG GAUZE ELAST 4 BULKY (GAUZE/BANDAGES/DRESSINGS) ×2 IMPLANT
CHLORAPREP W/TINT 26 (MISCELLANEOUS) ×2 IMPLANT
CORD BIPOLAR FORCEPS 12FT (ELECTRODE) ×2 IMPLANT
COVER BACK TABLE 60X90IN (DRAPES) ×2 IMPLANT
COVER MAYO STAND STRL (DRAPES) ×2 IMPLANT
CUFF TOURN SGL QUICK 18X4 (TOURNIQUET CUFF) ×2 IMPLANT
DRAPE EXTREMITY T 121X128X90 (DISPOSABLE) ×2 IMPLANT
DRAPE SURG 17X23 STRL (DRAPES) ×2 IMPLANT
DRSG PAD ABDOMINAL 8X10 ST (GAUZE/BANDAGES/DRESSINGS) ×2 IMPLANT
GAUZE SPONGE 4X4 12PLY STRL (GAUZE/BANDAGES/DRESSINGS) ×2 IMPLANT
GAUZE XEROFORM 1X8 LF (GAUZE/BANDAGES/DRESSINGS) ×2 IMPLANT
GLOVE SURG ORTHO LTX SZ8 (GLOVE) ×2 IMPLANT
GLOVE SURG UNDER POLY LF SZ8.5 (GLOVE) ×2 IMPLANT
GOWN STRL REUS W/ TWL LRG LVL3 (GOWN DISPOSABLE) ×1 IMPLANT
GOWN STRL REUS W/TWL LRG LVL3 (GOWN DISPOSABLE) ×2
GOWN STRL REUS W/TWL XL LVL3 (GOWN DISPOSABLE) ×2 IMPLANT
NEEDLE HYPO 27GX1-1/4 (NEEDLE) IMPLANT
NS IRRIG 1000ML POUR BTL (IV SOLUTION) ×2 IMPLANT
PACK BASIN DAY SURGERY FS (CUSTOM PROCEDURE TRAY) ×2 IMPLANT
STOCKINETTE 4X48 STRL (DRAPES) ×2 IMPLANT
SUT ETHILON 4 0 PS 2 18 (SUTURE) ×2 IMPLANT
SUT VICRYL 4-0 PS2 18IN ABS (SUTURE) IMPLANT
SYR BULB EAR ULCER 3OZ GRN STR (SYRINGE) ×2 IMPLANT
SYR CONTROL 10ML LL (SYRINGE) IMPLANT
TOWEL GREEN STERILE FF (TOWEL DISPOSABLE) ×2 IMPLANT
UNDERPAD 30X36 HEAVY ABSORB (UNDERPADS AND DIAPERS) ×2 IMPLANT

## 2021-05-11 NOTE — Discharge Instructions (Addendum)
Hand Center Instructions Hand Surgery  Wound Care: Keep your hand elevated above the level of your heart.  Do not allow it to dangle by your side.  Keep the dressing dry and do not remove it unless your doctor advises you to do so.  He will usually change it at the time of your post-op visit.  Moving your fingers is advised to stimulate circulation but will depend on the site of your surgery.  If you have a splint applied, your doctor will advise you regarding movement.  Activity: Do not drive or operate machinery today.  Rest today and then you may return to your normal activity and work as indicated by your physician.  Diet:  Drink liquids today or eat a light diet.  You may resume a regular diet tomorrow.    General expectations: Pain for two to three days. Fingers may become slightly swollen.  Call your doctor if any of the following occur: Severe pain not relieved by pain medication. Elevated temperature. Dressing soaked with blood. Inability to move fingers. White or bluish color to fingers. Patient has tramadol from prior surgery   Post Anesthesia Home Care Instructions  Activity: Get plenty of rest for the remainder of the day. A responsible individual must stay with you for 24 hours following the procedure.  For the next 24 hours, DO NOT: -Drive a car -Advertising copywriter -Drink alcoholic beverages -Take any medication unless instructed by your physician -Make any legal decisions or sign important papers.  Meals: Start with liquid foods such as gelatin or soup. Progress to regular foods as tolerated. Avoid greasy, spicy, heavy foods. If nausea and/or vomiting occur, drink only clear liquids until the nausea and/or vomiting subsides. Call your physician if vomiting continues.  Special Instructions/Symptoms: Your throat may feel dry or sore from the anesthesia or the breathing tube placed in your throat during surgery. If this causes discomfort, gargle with warm salt  water. The discomfort should disappear within 24 hours.  If you had a scopolamine patch placed behind your ear for the management of post- operative nausea and/or vomiting:  1. The medication in the patch is effective for 72 hours, after which it should be removed.  Wrap patch in a tissue and discard in the trash. Wash hands thoroughly with soap and water. 2. You may remove the patch earlier than 72 hours if you experience unpleasant side effects which may include dry mouth, dizziness or visual disturbances. 3. Avoid touching the patch. Wash your hands with soap and water after contact with the patch.

## 2021-05-11 NOTE — Anesthesia Procedure Notes (Signed)
Anesthesia Regional Block: Bier block (IV Regional)   Pre-Anesthetic Checklist: , timeout performed,  Correct Patient, Correct Site, Correct Laterality,  Correct Procedure, Correct Position, site marked,  Risks and benefits discussed,  Surgical consent,  Pre-op evaluation,  At surgeon's request  Laterality: Right  Prep: alcohol swabs       Needles:  Injection technique: Single-shot      Additional Needles:   Procedures:,,,,, intact distal pulses, Esmarch exsanguination,  Single tourniquet utilized    Narrative:  Start time: 05/11/2021 1:21 PM End time: 05/11/2021 1:22 PM  Performed by: Personally

## 2021-05-11 NOTE — Op Note (Signed)
NAME: Frank Moran MEDICAL RECORD NO: [id number] DATE OF BIRTH: [birth year] FACILITY: [facility] LOCATION: [facility] PHYSICIAN: Nicki Reaper, MD   OPERATIVE REPORT   DATE OF PROCEDURE: 05/11/21    PREOPERATIVE DIAGNOSIS: Carpal tunnel syndrome right   POSTOPERATIVE DIAGNOSIS: Same   PROCEDURE: Decompression median nerve right hand   SURGEON: Cindee Salt, M.D.   ASSISTANT: none   ANESTHESIA:  Bier block with sedation and Local   INTRAVENOUS FLUIDS:  Per anesthesia flow sheet.   ESTIMATED BLOOD LOSS:  Minimal.   COMPLICATIONS:  None.   SPECIMENS:  none   TOURNIQUET TIME:    Total Tourniquet Time Documented: Forearm (Right) - 26 minutes Total: Forearm (Right) - 26 minutes    DISPOSITION:  Stable to PACU.   INDICATIONS: Patient is a 65 year old male with a history of bilateral carpal tunnel syndrome nerve conductions positive which is not responded to conservative treatment he is undergone left carpal tunnel release in the past admitted now for right carpal tunnel release.  Pre-.  Postoperative course been discussed along with risk complications.  He is aware that there is no guarantee to the surgery the possibility of infection recurrence injury to arteries nerves tendons incomplete relief symptoms and dystrophy.  The preoperative area the patient is seen the extremity marked by both patient and surgeon antibiotic given  OPERATIVE COURSE: Patient is brought to the operating room placed in a supine position with the right arm free.  Forearm IV regional anesthetic was carried out without difficulty under the direction the anesthesia department.  He was prepped with ChloraPrep.  A 3-minute dry time was allowed timeout taken confirm patient procedure.  A longitudinal incision was made in the right palm carried down through subcutaneous tissue.  Bleeders were electrocauterized with bipolar.  The palmar fascia was split.  The superficial palmar arch was identified  along with the flexor tendon to the ring little finger.  Retractors were placed retracting median nerve flexor tendons radially ulnar nerve ulnarly.  The flexor retinaculum was then released on its ulnar border.  A sect see and sewellretractor placed between skin and forearm fascia and the distal forearm fascia proximal aspect of flexor retinaculum were then released under direct vision after dissecting deep structures free with blunt scissors.  The canal was explored.  Area compression of the nerve was apparent.  Motor branch entered into muscle distally.  The wound was copiously irrigated with saline.  The skin was closed with interrupted 4-0 nylon sutures.  Local infiltration quarter percent bupivacaine without epinephrine was given approximately 9 cc was used.  Sterile compressive dressing with the fingers free was applied.  Deflation of the tourniquet all fingers immediately pink.  He was taken to the recovery room for observation in satisfactory condition.  He will be discharged home to return to the hand center [facility] in 1 week on Tylenol ibuprofen for pain with Ultram for breakthrough.   Cindee Salt, MD Electronically signed, 05/11/21

## 2021-05-11 NOTE — Anesthesia Postprocedure Evaluation (Signed)
Anesthesia Post Note  Patient: Frank Moran  Procedure(s) Performed: RIGHT CARPAL TUNNEL RELEASE (Right: Wrist)     Patient location during evaluation: PACU Anesthesia Type: MAC Level of consciousness: awake and alert Pain management: pain level controlled Vital Signs Assessment: post-procedure vital signs reviewed and stable Respiratory status: spontaneous breathing, nonlabored ventilation, respiratory function stable and patient connected to nasal cannula oxygen Cardiovascular status: stable and blood pressure returned to baseline Postop Assessment: no apparent nausea or vomiting Anesthetic complications: no   No notable events documented.  Last Vitals:  Vitals:   05/11/21 1400 05/11/21 1425  BP: 128/79 133/88  Pulse: 74 73  Resp: 14 14  Temp:  36.6 C  SpO2: 100% 98%    Last Pain:  Vitals:   05/11/21 1425  TempSrc:   PainSc: 0-No pain                 Barnet Glasgow

## 2021-05-11 NOTE — Brief Op Note (Signed)
05/11/2021  1:59 PM  PATIENT:  Frank Moran  65 y.o. male  PRE-OPERATIVE DIAGNOSIS:  RIGHT CARPAL TUNNEL SYNDROME  POST-OPERATIVE DIAGNOSIS:  RIGHT CARPAL TUNNEL SYNDROME  PROCEDURE:  Procedure(s) with comments: RIGHT CARPAL TUNNEL RELEASE (Right) - 45 MIN  SURGEON:  Surgeon(s) and Role:    * Cindee Salt, MD - Primary  PHYSICIAN ASSISTANT:   ASSISTANTS: none   ANESTHESIA:   local, regional, and IV sedation  EBL:  2 mL   BLOOD ADMINISTERED:none  DRAINS: none   LOCAL MEDICATIONS USED:  BUPIVICAINE   SPECIMEN:  No Specimen  DISPOSITION OF SPECIMEN:  N/A  COUNTS:  YES  TOURNIQUET:   Total Tourniquet Time Documented: Forearm (Right) - 26 minutes Total: Forearm (Right) - 26 minutes   DICTATION: .Reubin Milan Dictation  PLAN OF CARE: Discharge to home after PACU  PATIENT DISPOSITION:  PACU - hemodynamically stable.

## 2021-05-11 NOTE — Anesthesia Preprocedure Evaluation (Signed)
Anesthesia Evaluation  Patient identified by MRN, date of birth, ID band Patient awake    Reviewed: Allergy & Precautions, NPO status , Patient's Chart, lab work & pertinent test results  History of Anesthesia Complications Negative for: history of anesthetic complications  Airway Mallampati: II  TM Distance: >3 FB Neck ROM: Full    Dental  (+) Upper Dentures, Dental Advisory Given   Pulmonary neg shortness of breath, neg sleep apnea, neg COPD, neg recent URI, former smoker,    breath sounds clear to auscultation       Cardiovascular hypertension, Pt. on medications (-) angina(-) Past MI and (-) CHF  Rhythm:Regular     Neuro/Psych negative neurological ROS  negative psych ROS   GI/Hepatic negative GI ROS, Neg liver ROS,   Endo/Other  negative endocrine ROS  Renal/GU negative Renal ROS     Musculoskeletal  (+) Arthritis , Osteoarthritis,    Abdominal   Peds  Hematology negative hematology ROS (+)   Anesthesia Other Findings   Reproductive/Obstetrics                             Anesthesia Physical  Anesthesia Plan  ASA: 2  Anesthesia Plan: MAC and Bier Block and Bier Block-LIDOCAINE ONLY   Post-op Pain Management:    Induction:   PONV Risk Score and Plan: 1 and Propofol infusion and Treatment may vary due to age or medical condition  Airway Management Planned: Nasal Cannula  Additional Equipment: None  Intra-op Plan:   Post-operative Plan:   Informed Consent: I have reviewed the patients History and Physical, chart, labs and discussed the procedure including the risks, benefits and alternatives for the proposed anesthesia with the patient or authorized representative who has indicated his/her understanding and acceptance.     Dental advisory given  Plan Discussed with: CRNA and Anesthesiologist  Anesthesia Plan Comments:         Anesthesia Quick Evaluation

## 2021-05-11 NOTE — Transfer of Care (Signed)
Immediate Anesthesia Transfer of Care Note  Patient: Frank Moran  Procedure(s) Performed: RIGHT CARPAL TUNNEL RELEASE (Right: Wrist)  Patient Location: PACU  Anesthesia Type:MAC and Bier block  Level of Consciousness: awake  Airway & Oxygen Therapy: Patient Spontanous Breathing and Patient connected to face mask oxygen  Post-op Assessment: Report given to RN, Post -op Vital signs reviewed and stable and Patient moving all extremities X 4  Post vital signs: Reviewed and stable  Last Vitals:  Vitals Value Taken Time  BP 131/76 05/11/21 1359  Temp 36.7 C 05/11/21 1359  Pulse 75 05/11/21 1359  Resp 12 05/11/21 1359  SpO2 100 % 05/11/21 1359  Vitals shown include unvalidated device data.  Last Pain:  Vitals:   05/11/21 1208  TempSrc: Oral  PainSc: 0-No pain      Patients Stated Pain Goal: 5 (68/25/74 9355)  Complications: No notable events documented.

## 2021-05-11 NOTE — H&P (Signed)
  Frank Moran is an 65 y.o. male.   Chief Complaint: numbness right hand HPI: Frank Moran is a 65 year old left hand dominant male referred by Dr. Docia Chuck for consultation regarding numbness and tingling with positive nerve conductions revealing severe carpal tunnel syndrome on the right with a motor delay of 12 2 on the left and 9 on the right and no response to the sensory component bilaterally. These were done in May 2022. These were done by Dr. Allena Katz. He is complaining of numbness tingling pain both hands left greater than right this been going on for years increasing over the past 6 months. He has a sharp pain with a VAS score 8/10. He has no history of injury to the hand or to the neck. He states driving and using pinch motion frequently increases his discomfort for him. He has not had any treatment for this. He has a history of arthritis no history of diabetes thyroid problems he does have a history of gout. Family history is positive arthritis negative for the remainder.He has had a carpal tunnel release left hand.   Past Medical History:  Diagnosis Date   Arthritis    Carpal tunnel syndrome    Gout    Hyperlipidemia    Hypertension     Past Surgical History:  Procedure Laterality Date   APPENDECTOMY     BICEPS TENDON REPAIR Left    CARPAL TUNNEL RELEASE Left 03/01/2021   Procedure: LEFT CARPAL TUNNEL RELEASE;  Surgeon: Cindee Salt, MD;  Location: [facility];  Service: Orthopedics;  Laterality: Left;  45 MIN   KNEE ARTHROSCOPY Bilateral    right x5 left x1    History reviewed. No pertinent family history. Social History:  reports that he has quit smoking. His smoking use included cigarettes. He has never used smokeless tobacco. He reports current alcohol use. No history on file for drug use.  Allergies: No Known Allergies  No medications prior to admission.    No results found for this or any previous visit (from the past 48 hour(s)).  No results  found.   Pertinent items are noted in HPI.  Height 5\' 9"  (1.753 m), weight 77.1 kg.  General appearance: alert, cooperative, and appears stated age Head: Normocephalic, without obvious abnormality Neck: no JVD Resp: clear to auscultation bilaterally Cardio: regular rate and rhythm, S1, S2 normal, no murmur, click, rub or gallop GI: soft, non-tender; bowel sounds normal; no masses,  no organomegaly Extremities: numbness right hand Pulses: 2+ and symmetric Skin: Skin color, texture, turgor normal. No rashes or lesions Neurologic: Grossly normal Incision/Wound: na  Assessment/Plan Diagnosis: carpal tunnel right Plan:  He would like to proceed to right carpal tunnel release. This to be scheduled as an outpatient under regional anesthesia. He is well aware of risk and complications including infection recurrence injury to arteries nerves tendons complete relief symptoms dystrophy. He scheduled for right carpal tunnel release under regional anesthesia. 05/11/2021, 5:33 AM

## 2021-05-15 ENCOUNTER — Encounter (HOSPITAL_BASED_OUTPATIENT_CLINIC_OR_DEPARTMENT_OTHER): Payer: Self-pay | Admitting: Orthopedic Surgery

## 2021-06-20 DIAGNOSIS — Z125 Encounter for screening for malignant neoplasm of prostate: Secondary | ICD-10-CM | POA: Diagnosis not present

## 2021-06-20 DIAGNOSIS — Z23 Encounter for immunization: Secondary | ICD-10-CM | POA: Diagnosis not present

## 2021-06-20 DIAGNOSIS — Z79899 Other long term (current) drug therapy: Secondary | ICD-10-CM | POA: Diagnosis not present

## 2021-06-20 DIAGNOSIS — M109 Gout, unspecified: Secondary | ICD-10-CM | POA: Diagnosis not present

## 2021-06-20 DIAGNOSIS — Z0001 Encounter for general adult medical examination with abnormal findings: Secondary | ICD-10-CM | POA: Diagnosis not present

## 2021-06-20 DIAGNOSIS — I10 Essential (primary) hypertension: Secondary | ICD-10-CM | POA: Diagnosis not present

## 2021-06-20 DIAGNOSIS — E7800 Pure hypercholesterolemia, unspecified: Secondary | ICD-10-CM | POA: Diagnosis not present

## 2021-06-20 DIAGNOSIS — Z136 Encounter for screening for cardiovascular disorders: Secondary | ICD-10-CM | POA: Diagnosis not present

## 2021-06-21 ENCOUNTER — Other Ambulatory Visit: Payer: Self-pay | Admitting: Family Medicine

## 2021-06-21 DIAGNOSIS — Z136 Encounter for screening for cardiovascular disorders: Secondary | ICD-10-CM

## 2021-08-13 DIAGNOSIS — M25562 Pain in left knee: Secondary | ICD-10-CM | POA: Diagnosis not present

## 2021-08-13 DIAGNOSIS — G8929 Other chronic pain: Secondary | ICD-10-CM | POA: Diagnosis not present

## 2021-08-13 DIAGNOSIS — M25561 Pain in right knee: Secondary | ICD-10-CM | POA: Diagnosis not present

## 2021-08-23 DIAGNOSIS — Z1211 Encounter for screening for malignant neoplasm of colon: Secondary | ICD-10-CM | POA: Diagnosis not present

## 2021-08-23 DIAGNOSIS — Z1212 Encounter for screening for malignant neoplasm of rectum: Secondary | ICD-10-CM | POA: Diagnosis not present

## 2021-08-28 DIAGNOSIS — M1711 Unilateral primary osteoarthritis, right knee: Secondary | ICD-10-CM | POA: Diagnosis not present

## 2021-08-28 DIAGNOSIS — M25561 Pain in right knee: Secondary | ICD-10-CM | POA: Diagnosis not present

## 2021-08-28 DIAGNOSIS — G8929 Other chronic pain: Secondary | ICD-10-CM | POA: Diagnosis not present

## 2021-09-25 DIAGNOSIS — M1711 Unilateral primary osteoarthritis, right knee: Secondary | ICD-10-CM | POA: Diagnosis not present

## 2021-11-14 DIAGNOSIS — M1712 Unilateral primary osteoarthritis, left knee: Secondary | ICD-10-CM | POA: Diagnosis not present

## 2021-11-14 DIAGNOSIS — M21612 Bunion of left foot: Secondary | ICD-10-CM | POA: Diagnosis not present

## 2021-11-22 DIAGNOSIS — M170 Bilateral primary osteoarthritis of knee: Secondary | ICD-10-CM | POA: Diagnosis not present

## 2021-11-28 DIAGNOSIS — M170 Bilateral primary osteoarthritis of knee: Secondary | ICD-10-CM | POA: Diagnosis not present

## 2021-12-05 DIAGNOSIS — M170 Bilateral primary osteoarthritis of knee: Secondary | ICD-10-CM | POA: Diagnosis not present

## 2022-01-22 DIAGNOSIS — E871 Hypo-osmolality and hyponatremia: Secondary | ICD-10-CM | POA: Diagnosis not present

## 2022-01-22 DIAGNOSIS — I10 Essential (primary) hypertension: Secondary | ICD-10-CM | POA: Diagnosis not present

## 2022-01-25 DIAGNOSIS — E871 Hypo-osmolality and hyponatremia: Secondary | ICD-10-CM | POA: Diagnosis not present

## 2022-02-01 DIAGNOSIS — E871 Hypo-osmolality and hyponatremia: Secondary | ICD-10-CM | POA: Diagnosis not present

## 2022-05-27 DIAGNOSIS — H25093 Other age-related incipient cataract, bilateral: Secondary | ICD-10-CM | POA: Diagnosis not present

## 2022-05-27 DIAGNOSIS — Z0100 Encounter for examination of eyes and vision without abnormal findings: Secondary | ICD-10-CM | POA: Diagnosis not present

## 2022-08-01 DIAGNOSIS — Z0000 Encounter for general adult medical examination without abnormal findings: Secondary | ICD-10-CM | POA: Diagnosis not present

## 2022-08-01 DIAGNOSIS — Z125 Encounter for screening for malignant neoplasm of prostate: Secondary | ICD-10-CM | POA: Diagnosis not present

## 2022-08-01 DIAGNOSIS — N5201 Erectile dysfunction due to arterial insufficiency: Secondary | ICD-10-CM | POA: Diagnosis not present

## 2022-08-01 DIAGNOSIS — I10 Essential (primary) hypertension: Secondary | ICD-10-CM | POA: Diagnosis not present

## 2022-08-01 DIAGNOSIS — M1A9XX0 Chronic gout, unspecified, without tophus (tophi): Secondary | ICD-10-CM | POA: Diagnosis not present

## 2022-08-01 DIAGNOSIS — D649 Anemia, unspecified: Secondary | ICD-10-CM | POA: Diagnosis not present

## 2022-08-01 DIAGNOSIS — E7800 Pure hypercholesterolemia, unspecified: Secondary | ICD-10-CM | POA: Diagnosis not present

## 2022-08-21 DIAGNOSIS — D649 Anemia, unspecified: Secondary | ICD-10-CM | POA: Diagnosis not present

## 2022-12-28 ENCOUNTER — Ambulatory Visit
Admission: RE | Admit: 2022-12-28 | Discharge: 2022-12-28 | Disposition: A | Discharge status: Home / Self Care | Payer: Medicare HMO | Source: Ambulatory Visit | Attending: Internal Medicine | Admitting: Internal Medicine

## 2022-12-28 VITALS — BP 122/69 | HR 87 | Temp 98.1°F | Resp 16

## 2022-12-28 DIAGNOSIS — H6123 Impacted cerumen, bilateral: Secondary | ICD-10-CM | POA: Diagnosis not present

## 2022-12-28 NOTE — ED Provider Notes (Signed)
[facility]    CSN: [id number] Arrival date & time: 12/28/22  0853      History   Chief Complaint Chief Complaint  Patient presents with   Ear Fullness    HPI Frank Moran is a 67 y.o. male.   Patient presents with feelings of left ear fullness that has been present for a few days.  Patient reports that he has a history of wax buildup in his ears.  He denies any ear fullness in his right ear but does want it checked as well.   Ear Fullness    Past Medical History:  Diagnosis Date   Arthritis    Carpal tunnel syndrome    Gout    Hyperlipidemia    Hypertension     There are no problems to display for this patient.   Past Surgical History:  Procedure Laterality Date   APPENDECTOMY     BICEPS TENDON REPAIR Left    CARPAL TUNNEL RELEASE Left 03/01/2021   Procedure: LEFT CARPAL TUNNEL RELEASE;  Surgeon: Cindee Salt, MD;  Location: [facility];  Service: Orthopedics;  Laterality: Left;  45 MIN   CARPAL TUNNEL RELEASE Right 05/11/2021   Procedure: RIGHT CARPAL TUNNEL RELEASE;  Surgeon: Cindee Salt, MD;  Location: [facility];  Service: Orthopedics;  Laterality: Right;  45 MIN   KNEE ARTHROSCOPY Bilateral    right x5 left x1       Home Medications    Prior to Admission medications   Medication Sig Start Date End Date Taking? Authorizing Provider  allopurinol (ZYLOPRIM) 300 MG tablet Take 300 mg by mouth daily.    [provider]  atorvastatin (LIPITOR) 10 MG tablet Take 10 mg by mouth daily.    [provider]  benazepril (LOTENSIN) 20 MG tablet Take 20 mg by mouth daily.    [provider]  Multiple Vitamin (MULTIVITAMIN WITH MINERALS) TABS tablet Take 1 tablet by mouth daily.    [provider]  sildenafil (REVATIO) 20 MG tablet Take 20 mg by mouth 3 (three) times daily.    [provider]  traMADol (ULTRAM) 50 MG tablet Take 1 tablet (50 mg total) by mouth every 6 (six)  hours as needed. 03/01/21   Cindee Salt, MD    Family History History reviewed. No pertinent family history.  Social History Social History   Tobacco Use   Smoking status: Former    Types: Cigarettes   Smokeless tobacco: Never  Substance Use Topics   Alcohol use: Yes    Comment: beer socially     Allergies   Patient has no known allergies.   Review of Systems Review of Systems Per HPI  Physical Exam Triage Vital Signs ED Triage Vitals [12/28/22 0904]  Enc Vitals Group     BP 122/69     Pulse Rate 87     Resp 16     Temp 98.1 F (36.7 C)     Temp Source Oral     SpO2 96 %     Weight      Height      Head Circumference      Peak Flow      Pain Score 0     Pain Loc      Pain Edu?      Excl. in GC?    No data found.  Updated Vital Signs BP 122/69 (BP Location: Left Arm)   Pulse 87   Temp 98.1 F (  36.7 C) (Oral)   Resp 16   SpO2 96%   Visual Acuity Right Eye Distance:   Left Eye Distance:   Bilateral Distance:    Right Eye Near:   Left Eye Near:    Bilateral Near:     Physical Exam Constitutional:      General: He is not in acute distress.    Appearance: Normal appearance. He is not toxic-appearing or diaphoretic.  HENT:     Head: Normocephalic and atraumatic.     Right Ear: There is impacted cerumen.     Left Ear: No drainage, swelling or tenderness.  No middle ear effusion. There is impacted cerumen. Tympanic membrane is not perforated, erythematous or bulging.     Ears:     Comments: Patient had impacted cerumen bilaterally on original exam.  On second physical exam, ear was irrigated completely to left ear.  There was still some remaining cerumen in the right ear.  Unable to completely visualize TM in right ear.  Left TM and canal appears normal. Eyes:     Extraocular Movements: Extraocular movements intact.     Conjunctiva/sclera: Conjunctivae normal.  Pulmonary:     Effort: Pulmonary effort is normal.  Neurological:     General: No  focal deficit present.     Mental Status: He is alert and oriented to person, place, and time. Mental status is at baseline.  Psychiatric:        Mood and Affect: Mood normal.        Behavior: Behavior normal.        Thought Content: Thought content normal.        Judgment: Judgment normal.      UC Treatments / Results  Labs (all labs ordered are listed, but only abnormal results are displayed) Labs Reviewed - No data to display  EKG   Radiology No results found.  Procedures Procedures (including critical care time)  Medications Ordered in UC Medications - No data to display  Initial Impression / Assessment and Plan / UC Course  I have reviewed the triage vital signs and the nursing notes.  Pertinent labs & imaging results that were available during my care of the patient were reviewed by me and considered in my medical decision making (see chart for details).     Left ear was completely irrigated with normal appearance of TM and canal on second physical exam.  Minimal cerumen removed with irrigation from right ear but patient reports that it is not bothersome and he does not want to repeat ear irrigation.  Therefore, advised patient to follow-up with any further concerns.  Patient verbalized understanding and was agreeable with plan. Final Clinical Impressions(s) / UC Diagnoses   Final diagnoses:  Impacted cerumen of both ears     Discharge Instructions      Follow-up with any further concerns.    ED Prescriptions   None    PDMP not reviewed this encounter.   Gustavus Bryant, [place] 12/28/22 1004

## 2022-12-28 NOTE — ED Triage Notes (Signed)
Pt states left ear fullness for  four days,  states he has a history of wax buildup in his ears.

## 2022-12-28 NOTE — Discharge Instructions (Signed)
Follow up with any further concerns.
